# Patient Record
Sex: Female | Born: 1980 | Race: White | Hispanic: Yes | Marital: Single | Smoking: Never smoker
Health system: Southern US, Community
[De-identification: ages and names within clinical notes are randomized; demographics above are authoritative.]

## PROBLEM LIST (undated history)

## (undated) DIAGNOSIS — J45909 Unspecified asthma, uncomplicated: Secondary | ICD-10-CM

## (undated) HISTORY — PX: NO PAST SURGERIES: SHX2092

---

## 2004-08-11 ENCOUNTER — Emergency Department (HOSPITAL_COMMUNITY): Admission: EM | Admit: 2004-08-11 | Discharge: 2004-08-11 | Payer: Self-pay | Admitting: Emergency Medicine

## 2004-09-04 ENCOUNTER — Ambulatory Visit (HOSPITAL_COMMUNITY): Admission: RE | Admit: 2004-09-04 | Discharge: 2004-09-04 | Payer: Self-pay | Admitting: Obstetrics and Gynecology

## 2004-09-13 ENCOUNTER — Ambulatory Visit: Payer: Self-pay | Admitting: *Deleted

## 2004-11-21 ENCOUNTER — Inpatient Hospital Stay (HOSPITAL_COMMUNITY): Admission: AD | Admit: 2004-11-21 | Discharge: 2004-11-21 | Payer: Self-pay | Admitting: *Deleted

## 2004-11-21 ENCOUNTER — Ambulatory Visit: Payer: Self-pay | Admitting: Family Medicine

## 2004-12-26 ENCOUNTER — Ambulatory Visit: Payer: Self-pay | Admitting: Obstetrics and Gynecology

## 2004-12-26 ENCOUNTER — Inpatient Hospital Stay (HOSPITAL_COMMUNITY): Admission: AD | Admit: 2004-12-26 | Discharge: 2004-12-29 | Payer: Self-pay | Admitting: *Deleted

## 2004-12-26 ENCOUNTER — Inpatient Hospital Stay (HOSPITAL_COMMUNITY): Admission: AD | Admit: 2004-12-26 | Discharge: 2004-12-26 | Payer: Self-pay | Admitting: Obstetrics and Gynecology

## 2005-04-21 ENCOUNTER — Emergency Department (HOSPITAL_COMMUNITY): Admission: EM | Admit: 2005-04-21 | Discharge: 2005-04-22 | Payer: Self-pay | Admitting: Emergency Medicine

## 2007-01-26 ENCOUNTER — Emergency Department (HOSPITAL_COMMUNITY): Admission: EM | Admit: 2007-01-26 | Discharge: 2007-01-26 | Payer: Self-pay | Admitting: Emergency Medicine

## 2010-11-17 ENCOUNTER — Encounter: Payer: Self-pay | Admitting: *Deleted

## 2011-10-28 NOTE — L&D Delivery Note (Signed)
Delivery Note At 3:58 AM a viable female was delivered via Vaginal, Spontaneous Delivery (Presentation: ; Occiput Anterior) through a tight unreducable cord.  APGAR: 9, 9; weight: 6 lbs 10.9 oz (3030g).   Placenta status: Intact, Spontaneous, via Tomasa Blase.  Cord: 3 vessels.    Anesthesia: none   Episiotomy: none Lacerations: small RT labia minora tear Suture Repair: none Est. Blood Loss (mL): 250  Mom to postpartum.  Baby skin-to-skin with mom.  [name], SNM 10/11/2012, 4:16 AM Supervised by: Thressa Sheller, CNM

## 2011-10-28 NOTE — L&D Delivery Note (Signed)
I have seen the patient with the resident/student and agree with the above.   

## 2012-05-24 ENCOUNTER — Other Ambulatory Visit (HOSPITAL_COMMUNITY): Payer: Self-pay | Admitting: Physician Assistant

## 2012-05-24 DIAGNOSIS — Z0489 Encounter for examination and observation for other specified reasons: Secondary | ICD-10-CM

## 2012-05-24 LAB — OB RESULTS CONSOLE GC/CHLAMYDIA
Chlamydia: NEGATIVE
Gonorrhea: NEGATIVE

## 2012-05-24 LAB — OB RESULTS CONSOLE ANTIBODY SCREEN: Antibody Screen: NEGATIVE

## 2012-05-24 LAB — OB RESULTS CONSOLE RUBELLA ANTIBODY, IGM: Rubella: IMMUNE

## 2012-06-03 ENCOUNTER — Ambulatory Visit (HOSPITAL_COMMUNITY)
Admission: RE | Admit: 2012-06-03 | Discharge: 2012-06-03 | Disposition: A | Discharge status: Home / Self Care | Payer: Self-pay | Source: Ambulatory Visit | Attending: Physician Assistant | Admitting: Physician Assistant

## 2012-06-03 DIAGNOSIS — O358XX0 Maternal care for other (suspected) fetal abnormality and damage, not applicable or unspecified: Secondary | ICD-10-CM | POA: Insufficient documentation

## 2012-06-03 DIAGNOSIS — O3660X0 Maternal care for excessive fetal growth, unspecified trimester, not applicable or unspecified: Secondary | ICD-10-CM | POA: Insufficient documentation

## 2012-06-03 DIAGNOSIS — Z0489 Encounter for examination and observation for other specified reasons: Secondary | ICD-10-CM

## 2012-06-03 DIAGNOSIS — Z1389 Encounter for screening for other disorder: Secondary | ICD-10-CM | POA: Insufficient documentation

## 2012-06-03 DIAGNOSIS — Z363 Encounter for antenatal screening for malformations: Secondary | ICD-10-CM | POA: Insufficient documentation

## 2012-10-08 LAB — OB RESULTS CONSOLE GBS: GBS: NEGATIVE

## 2012-10-10 ENCOUNTER — Encounter (HOSPITAL_COMMUNITY): Payer: Self-pay | Admitting: *Deleted

## 2012-10-10 ENCOUNTER — Inpatient Hospital Stay (HOSPITAL_COMMUNITY)
Admission: AD | Admit: 2012-10-10 | Discharge: 2012-10-12 | DRG: 775 | Disposition: A | Discharge status: Home / Self Care | Payer: Medicaid Other | Source: Ambulatory Visit | Attending: Obstetrics & Gynecology | Admitting: Obstetrics & Gynecology

## 2012-10-10 HISTORY — DX: Unspecified asthma, uncomplicated: J45.909

## 2012-10-10 LAB — URINALYSIS, ROUTINE W REFLEX MICROSCOPIC
Glucose, UA: NEGATIVE mg/dL
Ketones, ur: 15 mg/dL — AB
Leukocytes, UA: NEGATIVE
Nitrite: NEGATIVE
Protein, ur: NEGATIVE mg/dL
Specific Gravity, Urine: 1.02 (ref 1.005–1.030)
Urobilinogen, UA: 1 mg/dL (ref 0.0–1.0)
pH: 6 (ref 5.0–8.0)

## 2012-10-10 LAB — URINE MICROSCOPIC-ADD ON

## 2012-10-10 MED ORDER — LACTATED RINGERS IV BOLUS (SEPSIS)
1000.0000 mL | Freq: Once | INTRAVENOUS | Status: AC
  Administered 2012-10-10: 1000 mL via INTRAVENOUS

## 2012-10-10 MED ORDER — LACTATED RINGERS IV SOLN: INTRAVENOUS | Status: DC

## 2012-10-10 MED ORDER — NALBUPHINE SYRINGE 5 MG/0.5 ML
10.0000 mg | INJECTION | Freq: Once | INTRAMUSCULAR | Status: AC
  Administered 2012-10-10: 10 mg via INTRAMUSCULAR
  Filled 2012-10-10: qty 1

## 2012-10-10 NOTE — MAU Note (Signed)
Patient states contractions got worse around 14:30 today; "leaking blood, fluid and green fluid".

## 2012-10-10 NOTE — H&P (Signed)
Katherine Kim is a 31 y.o. female G2P1001 at [redacted] weeks gestation by second trimester ultrasound presenting for contractions that have been worse since 1430 today. She reports good fetal movement. Maternal Medical History:  Reason for admission: Reason for admission: contractions.  Contractions: Onset was 6-12 hours ago.   Frequency: regular.   Duration is approximately 3 minutes.   Perceived severity is strong.    Fetal activity: Perceived fetal activity is normal.   Last perceived fetal movement was within the past hour.    Prenatal complications: no prenatal complications   OB History    Grav Para Term Preterm Abortions TAB SAB Ect Mult Living   2 1        1      Past Medical History  Diagnosis Date  . Asthma    Past Surgical History  Procedure Date  . No past surgeries    Family History: family history is not on file. Social History:  reports that she has never smoked. She has never used smokeless tobacco. She reports that she does not drink alcohol or use illicit drugs.   Prenatal Transfer Tool  Maternal Diabetes: No Genetic Screening: Normal Maternal Ultrasounds/Referrals: Normal Fetal Ultrasounds or other Referrals:  None Maternal Substance Abuse:  No Significant Maternal Medications:  None Significant Maternal Lab Results:  None Other Comments:  None  Review of Systems  Constitutional: Negative.   HENT: Negative.   Eyes: Negative.   Respiratory: Negative.   Cardiovascular: Negative.   Gastrointestinal: Negative.   Genitourinary: Negative.   Musculoskeletal: Negative.   Skin: Negative.   Neurological: Negative.   Endo/Heme/Allergies: Negative.   Psychiatric/Behavioral: Negative.     Dilation: 3 Effacement (%): 90 Exam by:: Carloyn Jaeger, student CNM  Blood pressure 135/88, pulse 79, temperature 97.7 F (36.5 C), temperature source Oral, resp. rate 20, height 4\' 9"  (1.448 m), weight 60.328 kg (133 lb), SpO2 100.00%. Maternal Exam:  Uterine Assessment:  Contraction strength is firm.  Contraction duration is 3 minutes. Contraction frequency is regular.   Abdomen: Patient reports no abdominal tenderness. Fetal presentation: vertex  Introitus: Normal vulva. Normal vagina.  Pelvis: adequate for delivery.   Cervix: Cervix evaluated by digital exam.     Fetal Exam Fetal Monitor Review: Mode: ultrasound.   Baseline rate: 150.  Variability: moderate (6-25 bpm).   Pattern: accelerations present.    Fetal State Assessment: Category I - tracings are normal.     Physical Exam  Constitutional: She is oriented to person, place, and time. She appears well-developed and well-nourished.  HENT:  Head: Normocephalic and atraumatic.  Eyes: Conjunctivae normal are normal.  Neck: Normal range of motion. Neck supple.  Cardiovascular: Normal rate, regular rhythm, normal heart sounds and intact distal pulses.   Respiratory: Effort normal and breath sounds normal.  GI: Soft. Bowel sounds are normal.  Genitourinary: Vagina normal and uterus normal.  Musculoskeletal: Normal range of motion.  Neurological: She is alert and oriented to person, place, and time. She has normal reflexes.  Skin: Skin is warm.  Psychiatric: She has a normal mood and affect. Her behavior is normal. Judgment and thought content normal.    Prenatal labs: ABO, Rh:  O positive  Antibody:  Negative Rubella:  Immune RPR:  Non-Reactive HBsAg:  Negative HIV:  Non-reactive GBS:  Negative 1 hr GTT: 96 (04/2012), 72 (07/2012)  Assessment/Plan: IUP @ [redacted] weeks gestation Normal Labor  Admit to L&D Routine L&D orders Epidural prn Anticipate NSVD   [name], SNM 10/10/2012, 11:44  PM Supervised by: Thressa Sheller, CNM  I have seen the patient with the resident/student and agree with the above.   Attestation of Attending Supervision of Advanced Practitioner (PA/CNM/NP): Evaluation and management procedures were performed by the Advanced Practitioner under my supervision  and collaboration.  I have reviewed the Advanced Practitioner's note and chart, and I agree with the management and plan.  Jaynie Collins, MD, FACOG Attending Obstetrician & Gynecologist Faculty Practice, [facility] [place]

## 2012-10-10 NOTE — MAU Note (Signed)
Pt reports contractions q 5 minutes and some bleeding

## 2012-10-11 ENCOUNTER — Encounter (HOSPITAL_COMMUNITY): Payer: Self-pay | Admitting: *Deleted

## 2012-10-11 LAB — ABO/RH: ABO/RH(D): O POS

## 2012-10-11 LAB — CBC
HCT: 32 % — ABNORMAL LOW (ref 36.0–46.0)
Hemoglobin: 10.5 g/dL — ABNORMAL LOW (ref 12.0–15.0)
MCHC: 32.8 g/dL (ref 30.0–36.0)
RBC: 3.98 MIL/uL (ref 3.87–5.11)

## 2012-10-11 LAB — TYPE AND SCREEN
ABO/RH(D): O POS
Antibody Screen: NEGATIVE

## 2012-10-11 MED ORDER — LACTATED RINGERS IV SOLN: 500.0000 mL | INTRAVENOUS | Status: DC | PRN

## 2012-10-11 MED ORDER — EPHEDRINE 5 MG/ML INJ: 10.0000 mg | INTRAVENOUS | Status: DC | PRN

## 2012-10-11 MED ORDER — DIPHENHYDRAMINE HCL 50 MG/ML IJ SOLN: 12.5000 mg | INTRAMUSCULAR | Status: DC | PRN

## 2012-10-11 MED ORDER — OXYCODONE-ACETAMINOPHEN 5-325 MG PO TABS: 1.0000 | ORAL_TABLET | ORAL | Status: DC | PRN

## 2012-10-11 MED ORDER — ONDANSETRON HCL 4 MG/2ML IJ SOLN: 4.0000 mg | INTRAMUSCULAR | Status: DC | PRN

## 2012-10-11 MED ORDER — OXYTOCIN BOLUS FROM INFUSION: 500.0000 mL | INTRAVENOUS | Status: DC

## 2012-10-11 MED ORDER — OXYTOCIN 40 UNITS IN LACTATED RINGERS INFUSION - SIMPLE MED
62.5000 mL/h | INTRAVENOUS | Status: DC
  Filled 2012-10-11: qty 1000

## 2012-10-11 MED ORDER — LIDOCAINE HCL (PF) 1 % IJ SOLN
30.0000 mL | INTRAMUSCULAR | Status: DC | PRN
  Filled 2012-10-11: qty 30

## 2012-10-11 MED ORDER — ZOLPIDEM TARTRATE 5 MG PO TABS: 5.0000 mg | ORAL_TABLET | Freq: Every evening | ORAL | Status: DC | PRN

## 2012-10-11 MED ORDER — ONDANSETRON HCL 4 MG PO TABS: 4.0000 mg | ORAL_TABLET | ORAL | Status: DC | PRN

## 2012-10-11 MED ORDER — METHYLERGONOVINE MALEATE 0.2 MG PO TABS: 0.2000 mg | ORAL_TABLET | ORAL | Status: DC | PRN

## 2012-10-11 MED ORDER — EPHEDRINE 5 MG/ML INJ
10.0000 mg | INTRAVENOUS | Status: DC | PRN
  Filled 2012-10-11: qty 4

## 2012-10-11 MED ORDER — IBUPROFEN 600 MG PO TABS
600.0000 mg | ORAL_TABLET | Freq: Four times a day (QID) | ORAL | Status: DC
  Administered 2012-10-11 – 2012-10-12 (×6): 600 mg via ORAL
  Filled 2012-10-11 (×6): qty 1

## 2012-10-11 MED ORDER — LANOLIN HYDROUS EX OINT: TOPICAL_OINTMENT | CUTANEOUS | Status: DC | PRN

## 2012-10-11 MED ORDER — SIMETHICONE 80 MG PO CHEW: 80.0000 mg | CHEWABLE_TABLET | ORAL | Status: DC | PRN

## 2012-10-11 MED ORDER — MEASLES, MUMPS & RUBELLA VAC ~~LOC~~ INJ
0.5000 mL | INJECTION | Freq: Once | SUBCUTANEOUS | Status: DC
  Filled 2012-10-11: qty 0.5

## 2012-10-11 MED ORDER — BENZOCAINE-MENTHOL 20-0.5 % EX AERO: 1.0000 "application " | INHALATION_SPRAY | CUTANEOUS | Status: DC | PRN

## 2012-10-11 MED ORDER — SODIUM CHLORIDE 0.9 % IJ SOLN: 3.0000 mL | Freq: Two times a day (BID) | INTRAMUSCULAR | Status: DC

## 2012-10-11 MED ORDER — METHYLERGONOVINE MALEATE 0.2 MG/ML IJ SOLN: 0.2000 mg | INTRAMUSCULAR | Status: DC | PRN

## 2012-10-11 MED ORDER — TETANUS-DIPHTH-ACELL PERTUSSIS 5-2.5-18.5 LF-MCG/0.5 IM SUSP: 0.5000 mL | Freq: Once | INTRAMUSCULAR | Status: DC

## 2012-10-11 MED ORDER — WITCH HAZEL-GLYCERIN EX PADS: 1.0000 "application " | MEDICATED_PAD | CUTANEOUS | Status: DC | PRN

## 2012-10-11 MED ORDER — PHENYLEPHRINE 40 MCG/ML (10ML) SYRINGE FOR IV PUSH (FOR BLOOD PRESSURE SUPPORT): 80.0000 ug | PREFILLED_SYRINGE | INTRAVENOUS | Status: DC | PRN

## 2012-10-11 MED ORDER — LACTATED RINGERS IV SOLN: 500.0000 mL | Freq: Once | INTRAVENOUS | Status: DC

## 2012-10-11 MED ORDER — FENTANYL 2.5 MCG/ML BUPIVACAINE 1/10 % EPIDURAL INFUSION (WH - ANES)
14.0000 mL/h | INTRAMUSCULAR | Status: DC
  Filled 2012-10-11: qty 125

## 2012-10-11 MED ORDER — IBUPROFEN 600 MG PO TABS: 600.0000 mg | ORAL_TABLET | Freq: Four times a day (QID) | ORAL | Status: DC | PRN

## 2012-10-11 MED ORDER — CITRIC ACID-SODIUM CITRATE 334-500 MG/5ML PO SOLN: 30.0000 mL | ORAL | Status: DC | PRN

## 2012-10-11 MED ORDER — ONDANSETRON HCL 4 MG/2ML IJ SOLN: 4.0000 mg | Freq: Four times a day (QID) | INTRAMUSCULAR | Status: DC | PRN

## 2012-10-11 MED ORDER — SENNOSIDES-DOCUSATE SODIUM 8.6-50 MG PO TABS: 2.0000 | ORAL_TABLET | Freq: Every day | ORAL | Status: DC

## 2012-10-11 MED ORDER — FLEET ENEMA 7-19 GM/118ML RE ENEM: 1.0000 | ENEMA | RECTAL | Status: DC | PRN

## 2012-10-11 MED ORDER — SODIUM CHLORIDE 0.9 % IV SOLN: 250.0000 mL | INTRAVENOUS | Status: DC | PRN

## 2012-10-11 MED ORDER — DIBUCAINE 1 % RE OINT: 1.0000 "application " | TOPICAL_OINTMENT | RECTAL | Status: DC | PRN

## 2012-10-11 MED ORDER — ACETAMINOPHEN 325 MG PO TABS: 650.0000 mg | ORAL_TABLET | ORAL | Status: DC | PRN

## 2012-10-11 MED ORDER — SODIUM CHLORIDE 0.9 % IJ SOLN: 3.0000 mL | INTRAMUSCULAR | Status: DC | PRN

## 2012-10-11 MED ORDER — PHENYLEPHRINE 40 MCG/ML (10ML) SYRINGE FOR IV PUSH (FOR BLOOD PRESSURE SUPPORT)
80.0000 ug | PREFILLED_SYRINGE | INTRAVENOUS | Status: DC | PRN
  Filled 2012-10-11: qty 5

## 2012-10-11 MED ORDER — PRENATAL MULTIVITAMIN CH
1.0000 | ORAL_TABLET | Freq: Every day | ORAL | Status: DC
  Administered 2012-10-11 – 2012-10-12 (×2): 1 via ORAL
  Filled 2012-10-11 (×2): qty 1

## 2012-10-11 MED ORDER — DIPHENHYDRAMINE HCL 25 MG PO CAPS: 25.0000 mg | ORAL_CAPSULE | Freq: Four times a day (QID) | ORAL | Status: DC | PRN

## 2012-10-11 MED ORDER — MAGNESIUM HYDROXIDE 400 MG/5ML PO SUSP: 30.0000 mL | ORAL | Status: DC | PRN

## 2012-10-11 MED ORDER — NALBUPHINE SYRINGE 5 MG/0.5 ML
10.0000 mg | INJECTION | INTRAMUSCULAR | Status: DC | PRN
  Administered 2012-10-11: 10 mg via INTRAVENOUS
  Filled 2012-10-11 (×2): qty 1

## 2012-10-11 NOTE — Progress Notes (Signed)
S: Very anxious, unable to relax with pain.  Pelvic pressure with each contraction.  Good fetal movement.   OCeasar Mons Vitals:   10/10/12 2055 10/11/12 0023 10/11/12 0024 10/11/12 0236  BP: 135/88  115/66 132/74  Pulse: 79  89 87  Temp: 97.7 F (36.5 C) 98.9 F (37.2 C)  98.2 F (36.8 C)  TempSrc: Oral Oral  Oral  Resp: 20 20  20   Height: 4\' 9"  (1.448 m)     Weight: 60.328 kg (133 lb)     SpO2: 100%        FHT:  FHR: 145 bpm, variability: moderate,  accelerations:  Present,  decelerations:  Absent, variables. UC:   regular, every 2-3 minutes SVE:   Dilation: 7 Effacement (%): 100 Station: -1 Exam by:: [name], SNM   A / P: Spontaneous labor, progressing normally Meconium Stained Fluid  Fetal Wellbeing:  Category I Pain Control:  Nubain  Anticipated MOD:  NSVD  [name], SNM 10/11/2012, 3:25 AM Supervised by: Thressa Sheller, CNM

## 2012-10-11 NOTE — Progress Notes (Signed)
I have seen the patient with the resident/student and agree with the above.   

## 2012-10-12 MED ORDER — IBUPROFEN 600 MG PO TABS
600.0000 mg | ORAL_TABLET | Freq: Four times a day (QID) | ORAL | Status: AC
Start: 2012-10-12 — End: ?

## 2012-10-12 NOTE — Discharge Summary (Signed)
Obstetric Discharge Summary Reason for Admission: onset of labor Prenatal Procedures: none Intrapartum Procedures: spontaneous vaginal delivery Postpartum Procedures: none Complications-Operative and Postpartum: none Hemoglobin  Date Value Range Status  10/10/2012 10.5* 12.0 - 15.0 g/dL Final     HCT  Date Value Range Status  10/10/2012 32.0* 36.0 - 46.0 % Final    Physical Exam:  General: alert, cooperative and no distress Lochia: appropriate Uterine Fundus: firm DVT Evaluation: No evidence of DVT seen on physical exam.  Discharge Diagnoses: Term Pregnancy-delivered  Discharge Information: Date: 10/12/2012 Activity: pelvic rest Diet: routine Medications: Ibuprofen Condition: stable Instructions: refer to practice specific booklet Discharge to: home Pt won't decided on birth control. Pt will breastfeeding and bottle feeding  Newborn Data: Live born female  Birth Weight: 6 lb 10.9 oz (3030 g) APGAR: 9, 9  Home with mother.  Governor Specking 10/12/2012, 7:40 AM

## 2012-10-12 NOTE — Progress Notes (Signed)
Reviewed discharge information with pt. Offered to call interpretor for discharge instructions and pt refused. Pt stated she understood all information.

## 2012-10-13 NOTE — Discharge Summary (Signed)
I saw and examined patient and agree with above. Trang Bouse, MD 

## 2014-01-25 IMAGING — US US OB DETAIL+14 WK
1 of 2 series · 12 of 28 positions shown · non-contrast
Comparison: none

[Series 1: us ob detail +14 wk · 12 of 89 slices shown]
[im 1/89]
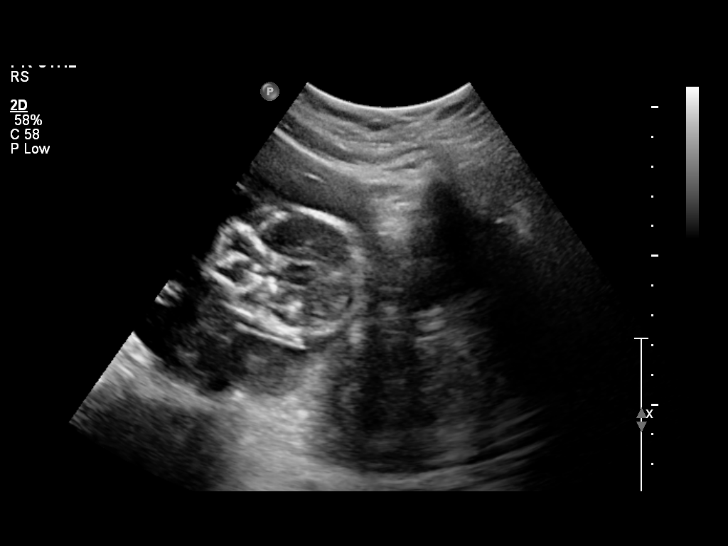
[im 7/89]
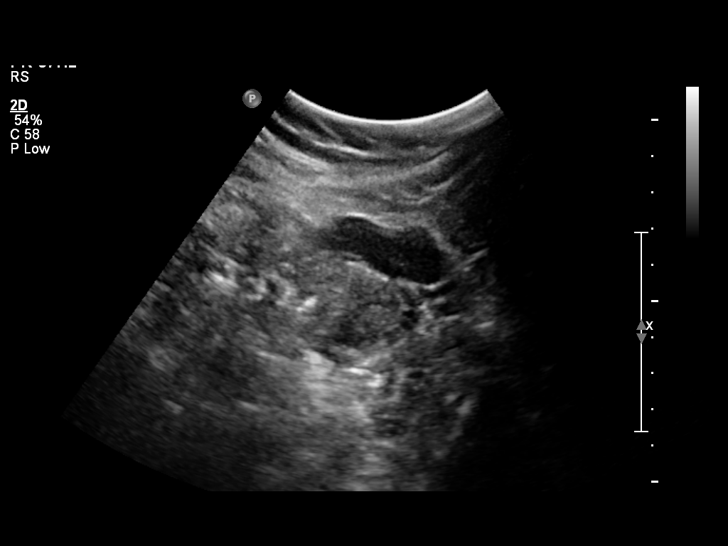
[im 14/89]
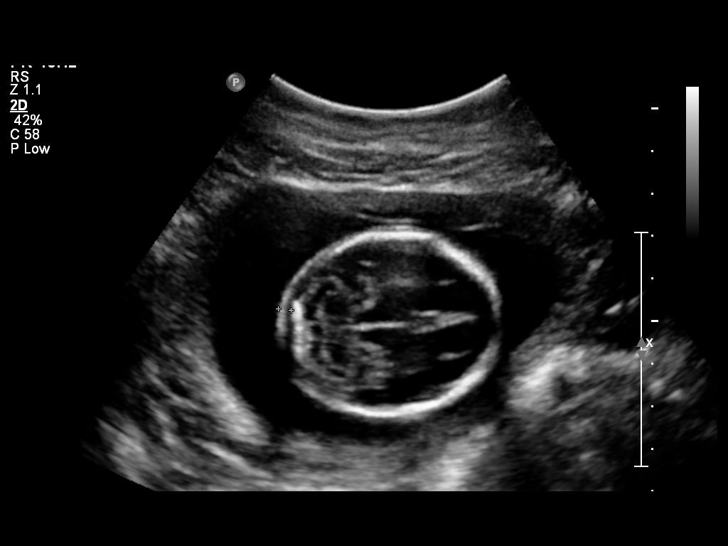
[im 24/89]
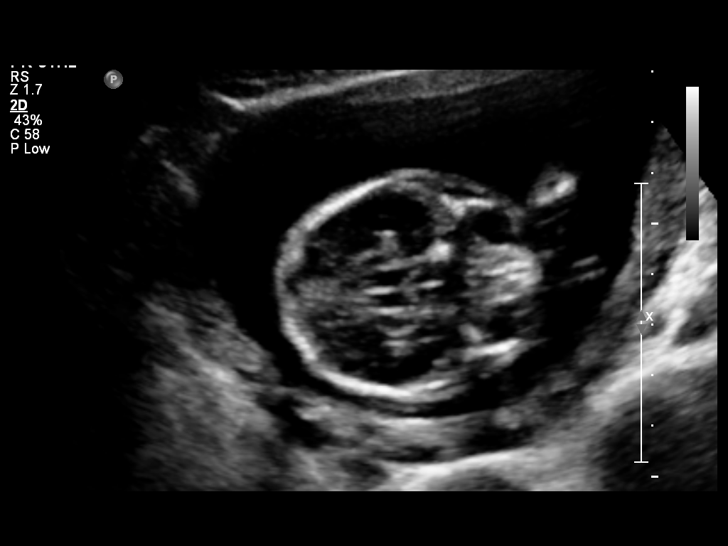
[im 31/89]
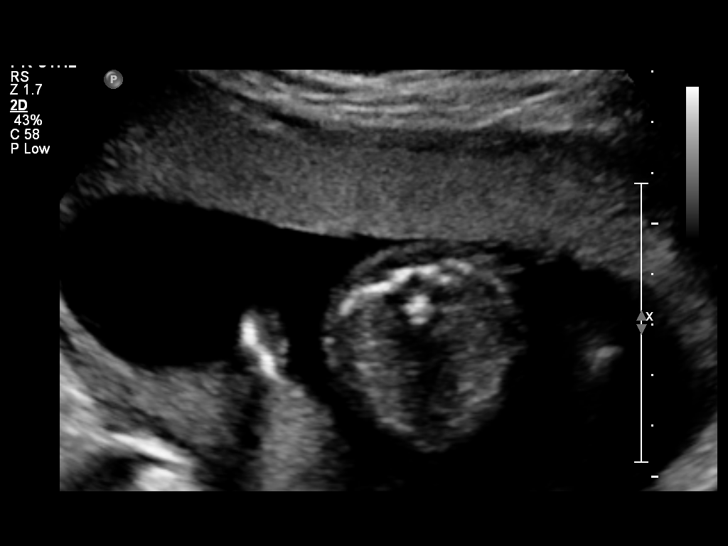
[im 38/89]
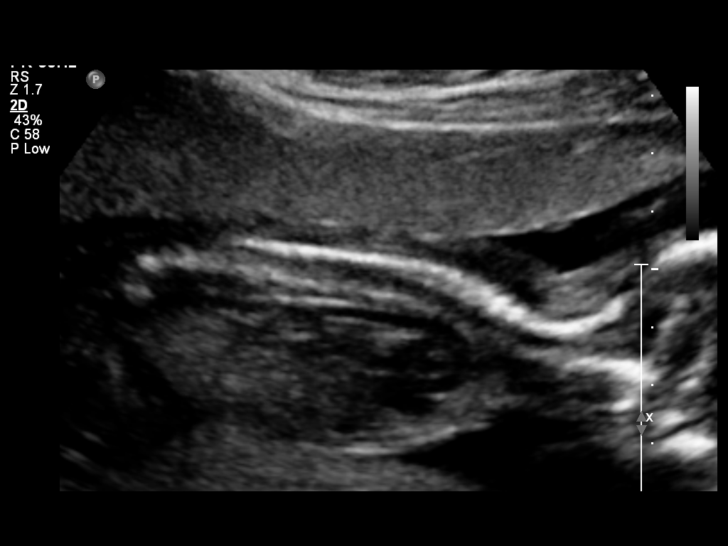
[im 48/89]
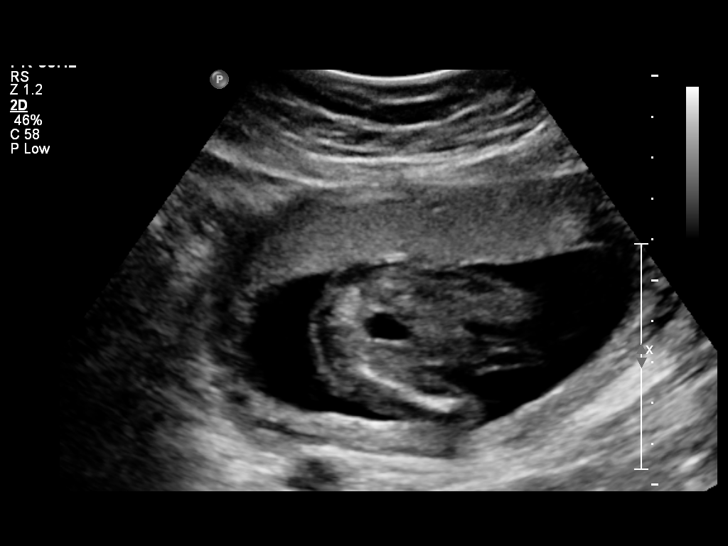
[im 55/89]
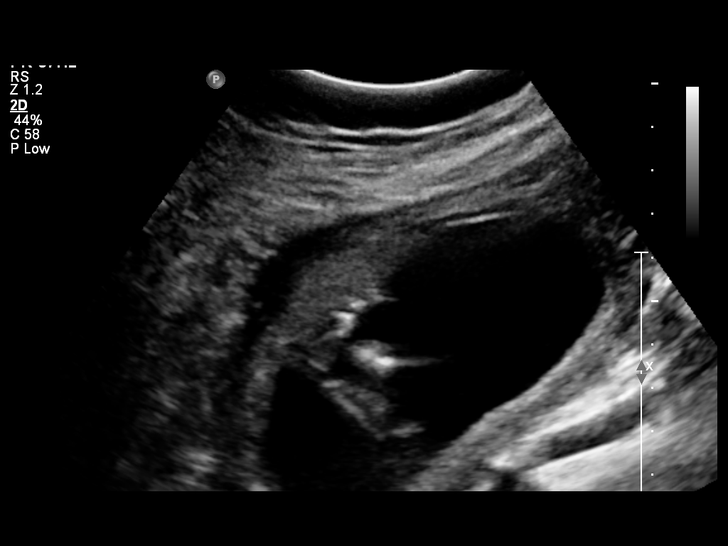
[im 61/89]
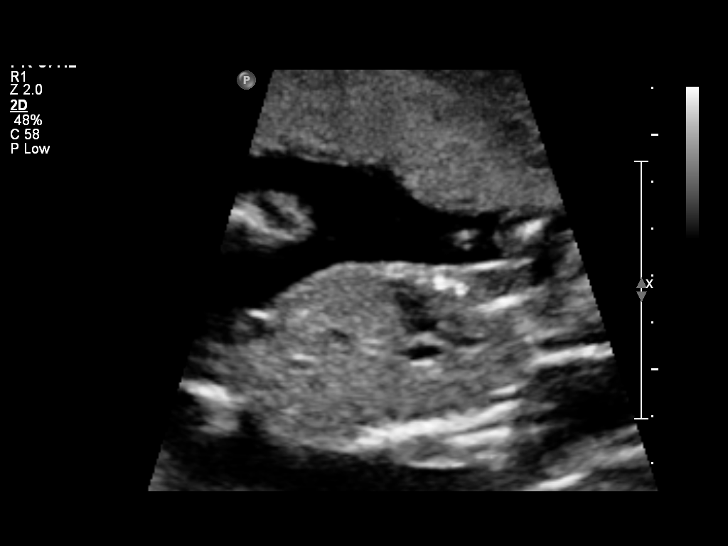
[im 72/89]
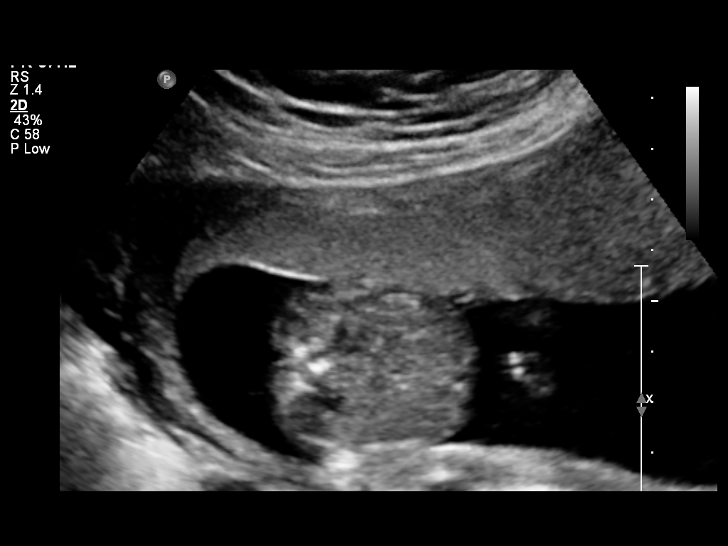
[im 78/89]
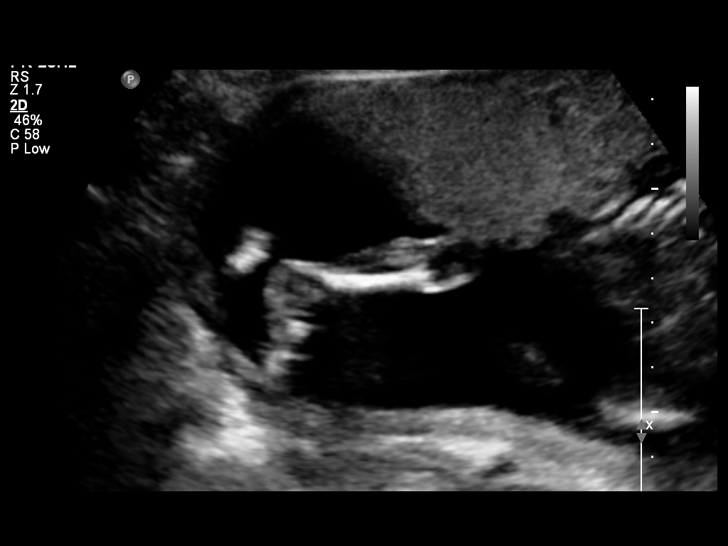
[im 85/89]
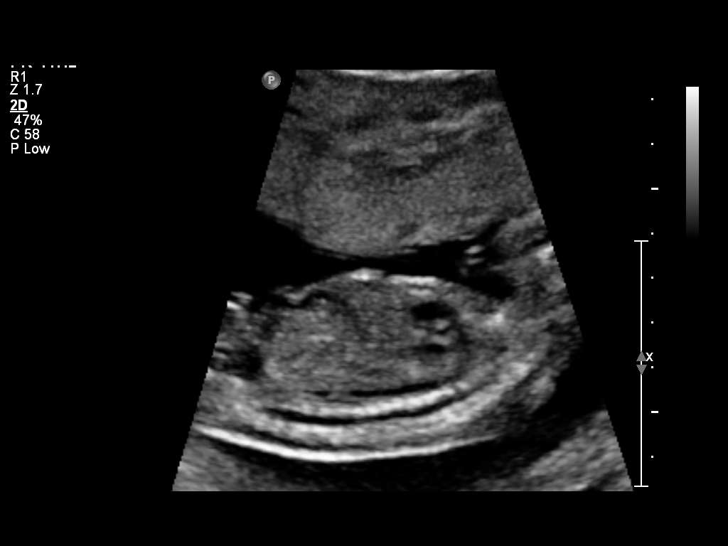

[12 of 28 positions shown; findings below may reference images not displayed]

OBSTETRICS REPORT
                      (Signed Final 06/03/2012 [DATE])

                 PA
 Order#:         [id number]
Procedures

 US OB DETAIL + 14 WK                                  76811.0
Indications

 Detailed fetal anatomic survey
 Uncertain LMP;  Establish Gestational [AGE]
 Size greater than dates (Large for gestational [AGE]
Fetal Evaluation

 Fetal Heart Rate:  165                         bpm
 Cardiac Activity:  Observed
 Presentation:      Cephalic
 Placenta:          Anterior, above cervical os
 P. Cord            Visualized
 Insertion:

 Amniotic Fluid
 AFI FV:      Subjectively within normal limits
                                             Larg Pckt:     4.0  cm
Biometry

 BPD:     40.5  mm    G. Age:   18w 2d                CI:        71.11   70 - 86
                                                      FL/HC:      18.3   16.1 -

 HC:       153  mm    G. Age:   18w 2d       29  %    HC/AC:      1.14   1.09 -

 AC:     133.8  mm    G. Age:   18w 6d       56  %    FL/BPD:
 FL:        28  mm    G. Age:   18w 4d       44  %    FL/AC:      20.9   20 - 24

 Est. FW:     251  gm      0 lb 9 oz     48  %
Gestational Age

 U/S Today:     18w 4d                                        EDD:   10/31/12
 Best:          18w 4d    Det. By:   U/S (06/03/12)           EDD:   10/31/12
Anatomy
 Cranium:           Appears normal      Aortic Arch:       Appears normal
 Fetal Cavum:       Appears normal      Ductal Arch:       Appears normal
 Ventricles:        Appears normal      Diaphragm:         Appears normal
 Choroid Plexus:    Appears normal      Stomach:           Appears
                                                           normal, left
                                                           sided
 Cerebellum:        Appears normal      Abdomen:           Appears normal
 Posterior Fossa:   Appears normal      Abdominal Wall:    Appears nml
                                                           (cord insert,
                                                           abd wall)
 Nuchal Fold:       Appears normal      Cord Vessels:      Appears normal
                    (neck, nuchal                          (3 vessel cord)
                    fold)
 Face:              Appears normal      Kidneys:           Appear normal
                    (lips/profile/orbit
                    s)
 Heart:             Appears normal      Bladder:           Appears normal
                    (4 chamber &
                    axis)
 RVOT:              Appears normal      Spine:             Appears normal
 LVOT:              Appears normal      Limbs:             Four extremities
                                                           seen

 Other:     Male gender. Heels and 5th digit visualized. Heels
            visualized. Technically difficult due to fetal position.
Cervix Uterus Adnexa

 Cervical Length:   4.2       cm

 Cervix:       Closed. Normal appearance by transabdominal scan.
 Left Ovary:   Within normal limits.
 Right Ovary:  Within normal limits.

 Adnexa:     No abnormality visualized.
Impression

 Siup demonstrating an EGA by ultrasound of 18w 4d. Fetal
 parameters correlate well with this composite EGA.

 No focal fetal or placental abnormalities are noted with a
 good anatomic evaluation possible. No soft markers for Down
 Syndrome are seen. Given the expected age at delivery of
 31, today's normal ultrasound would decrease the age related
 risk for Down Syndrome from [DATE] to [DATE] (Orsini et al).
 Correlation with other aneuploidy screening results, if
 available, would be recommended for a more complete risk
 assessment.

 Subjectively and quantitatively normal amniotic fluid volume.
 Normal cervical length.

## 2014-08-28 ENCOUNTER — Encounter (HOSPITAL_COMMUNITY): Payer: Self-pay | Admitting: *Deleted

## 2023-03-11 ENCOUNTER — Other Ambulatory Visit: Payer: Self-pay | Admitting: Obstetrics and Gynecology

## 2023-03-11 DIAGNOSIS — Z1231 Encounter for screening mammogram for malignant neoplasm of breast: Secondary | ICD-10-CM

## 2023-03-19 ENCOUNTER — Ambulatory Visit: Payer: Self-pay | Admitting: Hematology and Oncology

## 2023-03-19 ENCOUNTER — Ambulatory Visit
Admission: RE | Admit: 2023-03-19 | Discharge: 2023-03-19 | Disposition: A | Discharge status: Home / Self Care | Payer: No Typology Code available for payment source | Source: Ambulatory Visit | Attending: Obstetrics and Gynecology | Admitting: Obstetrics and Gynecology

## 2023-03-19 VITALS — BP 107/69 | Wt 116.0 lb

## 2023-03-19 DIAGNOSIS — Z01419 Encounter for gynecological examination (general) (routine) without abnormal findings: Secondary | ICD-10-CM

## 2023-03-19 DIAGNOSIS — Z1231 Encounter for screening mammogram for malignant neoplasm of breast: Secondary | ICD-10-CM

## 2023-03-19 DIAGNOSIS — Z124 Encounter for screening for malignant neoplasm of cervix: Secondary | ICD-10-CM

## 2023-03-19 NOTE — Patient Instructions (Signed)
Taught Katherine Kim about self breast awareness and gave educational materials to take home. Patient did need a Pap smear today due to last Pap smear was 10 years ago per patient. Let her know [facility] will cover Pap smears every 5 years unless has a history of abnormal Pap smears. Referred patient to the [facility] for screening mammogram. Appointment scheduled for 03/19/2023. Patient aware of appointment and will be there. Let patient know will follow up with her within the next couple weeks with results. Katherine Kim verbalized understanding.  Pascal Lux, NP 2:38 PM

## 2023-03-19 NOTE — Progress Notes (Signed)
Ms. Pranaya Conerly is a 42 y.o. G81P1002 female who presents to [facility] clinic today with no complaints.    Pap Smear: Pap smear completed today. Last Pap smear was 10 years ago and was normal. Per patient has no history of an abnormal Pap smear. Last Pap smear result is not available in Epic.   Physical exam: Breasts Breasts symmetrical. No skin abnormalities bilateral breasts. No nipple retraction bilateral breasts. No nipple discharge bilateral breasts. No lymphadenopathy. No lumps palpated bilateral breasts.       Pelvic/Bimanual Ext Genitalia No lesions, no swelling and no discharge observed on external genitalia.        Vagina Vagina pink and normal texture. No lesions or discharge observed in vagina.        Cervix Cervix is present. Cervix pink and of normal texture. No discharge observed.    Uterus Uterus is present and palpable. Uterus in normal position and normal size.        Adnexae Bilateral ovaries present and palpable. No tenderness on palpation.         Rectovaginal No rectal exam completed today since patient had no rectal complaints. No skin abnormalities observed on exam.     Smoking History: Patient has never smoked and was not referred to quit line.    Patient Navigation: Patient education provided. Access to services provided for patient through [facility] program. No interpreter provided. No transportation provided   Colorectal Cancer Screening: Per patient has never had colonoscopy completed No complaints today.    Breast and Cervical Cancer Risk Assessment: Patient does not have family history of breast cancer, known genetic mutations, or radiation treatment to the chest before age 66. Patient does not have history of cervical dysplasia, immunocompromised, or DES exposure in-utero.  Risk Scores as of 03/19/2023     Dondra Spry           5-year 0.45 %   Lifetime 8.26 %   This patient is [place] but has no documented birth country, so the [place] model used  data from [place] patients to calculate their risk score. Document a birth country in the Demographics activity for a more accurate score.         Last calculated by Caprice Red, CMA on 03/19/2023 at  2:23 PM        A: [facility] exam with pap smear No complaints with benign exam.   P: Referred patient to the [facility] for a screening mammogram. Appointment scheduled 03/19/23.  Pascal Lux, NP 03/19/2023 2:36 PM

## 2023-03-26 LAB — CYTOLOGY - PAP
Comment: NEGATIVE
Diagnosis: NEGATIVE
High risk HPV: NEGATIVE

## 2023-04-02 ENCOUNTER — Ambulatory Visit: Payer: Self-pay

## 2023-04-20 ENCOUNTER — Other Ambulatory Visit: Payer: Self-pay
